# Patient Record
Sex: Male | Born: 2000 | Race: White | Hispanic: No | Marital: Single | Smoking: Never smoker
Health system: Southern US, Community
[De-identification: ages and names within clinical notes are randomized; demographics above are authoritative.]

## PROBLEM LIST (undated history)

## (undated) DIAGNOSIS — J45909 Unspecified asthma, uncomplicated: Secondary | ICD-10-CM

## (undated) DIAGNOSIS — F909 Attention-deficit hyperactivity disorder, unspecified type: Secondary | ICD-10-CM

## (undated) DIAGNOSIS — F4000 Agoraphobia, unspecified: Secondary | ICD-10-CM

## (undated) DIAGNOSIS — F410 Panic disorder [episodic paroxysmal anxiety] without agoraphobia: Secondary | ICD-10-CM

## (undated) DIAGNOSIS — F411 Generalized anxiety disorder: Secondary | ICD-10-CM

## (undated) DIAGNOSIS — F902 Attention-deficit hyperactivity disorder, combined type: Secondary | ICD-10-CM

## (undated) DIAGNOSIS — F319 Bipolar disorder, unspecified: Secondary | ICD-10-CM

## (undated) DIAGNOSIS — F259 Schizoaffective disorder, unspecified: Secondary | ICD-10-CM

## (undated) HISTORY — PX: TYMPANOPLASTY: SHX33

## (undated) HISTORY — DX: Agoraphobia, unspecified: F40.00

## (undated) HISTORY — DX: Unspecified asthma, uncomplicated: J45.909

## (undated) HISTORY — DX: Attention-deficit hyperactivity disorder, combined type: F90.2

## (undated) HISTORY — DX: Attention-deficit hyperactivity disorder, unspecified type: F90.9

## (undated) HISTORY — DX: Generalized anxiety disorder: F41.1

## (undated) HISTORY — DX: Panic disorder (episodic paroxysmal anxiety): F41.0

## (undated) HISTORY — DX: Bipolar disorder, unspecified: F31.9

## (undated) HISTORY — DX: Schizoaffective disorder, unspecified: F25.9

---
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
## 2022-04-02 ENCOUNTER — Emergency Department
Admission: EM | Admit: 2022-04-02 | Discharge: 2022-04-02 | Disposition: A | Discharge status: Home / Self Care | Payer: Medicaid - Out of State | Attending: Emergency Medicine | Admitting: Emergency Medicine

## 2022-04-02 ENCOUNTER — Emergency Department: Payer: Medicaid - Out of State

## 2022-04-02 DIAGNOSIS — M79645 Pain in left finger(s): Secondary | ICD-10-CM | POA: Insufficient documentation

## 2022-04-02 DIAGNOSIS — Z23 Encounter for immunization: Secondary | ICD-10-CM | POA: Insufficient documentation

## 2022-04-02 DIAGNOSIS — S61012A Laceration without foreign body of left thumb without damage to nail, initial encounter: Secondary | ICD-10-CM | POA: Insufficient documentation

## 2022-04-02 DIAGNOSIS — Y990 Civilian activity done for income or pay: Secondary | ICD-10-CM | POA: Insufficient documentation

## 2022-04-02 DIAGNOSIS — W293XXA Contact with powered garden and outdoor hand tools and machinery, initial encounter: Secondary | ICD-10-CM | POA: Insufficient documentation

## 2022-04-02 MED ORDER — CEPHALEXIN 500 MG PO CAPS
500.0000 mg | ORAL_CAPSULE | Freq: Four times a day (QID) | ORAL | 0 refills | Status: AC
Start: 2022-04-02 — End: 2022-04-12

## 2022-04-02 MED ORDER — TETANUS-DIPHTH-ACELL PERTUSSIS 5-2.5-18.5 LF-MCG/0.5 IM SUSY
PREFILLED_SYRINGE | INTRAMUSCULAR | Status: AC
Start: 2022-04-02 — End: ?
  Filled 2022-04-02: qty 0.5

## 2022-04-02 MED ORDER — TETANUS-DIPHTH-ACELL PERTUSSIS 5-2.5-18.5 LF-MCG/0.5 IM SUSY
0.5000 mL | PREFILLED_SYRINGE | INTRAMUSCULAR | Status: AC | PRN
Start: 2022-04-02 — End: 2022-04-02
  Administered 2022-04-02: 0.5 mL via INTRAMUSCULAR

## 2022-04-02 MED ORDER — LIDOCAINE HCL 1 % IJ SOLN
10.0000 mL | Freq: Once | INTRAMUSCULAR | Status: AC
Start: 2022-04-02 — End: 2022-04-02
  Administered 2022-04-02: 10 mL
  Filled 2022-04-02: qty 10

## 2022-04-02 MED ORDER — IBUPROFEN 600 MG PO TABS
600.0000 mg | ORAL_TABLET | Freq: Three times a day (TID) | ORAL | 0 refills | Status: AC | PRN
Start: 2022-04-02 — End: ?

## 2022-04-02 NOTE — ED Provider Notes (Signed)
EMERGENCY DEPARTMENT  History and Physical Exam       Patient Name: Todd Kerr, Todd Kerr  Encounter Date:  04/02/2022  Attending Physician: Nigel Sloop, DO.  PCP: No primary care provider on file.  Patient DOB:  Sep 15, 2000  MRN:  [id number]  Room:  E58/E58-A    History obtained from: Patient    Past Medical Records reviewed: no previous records available to review    Other Medical Conditions that Impact Care: Bipolar disorder, schizophrenia    Differential Diagnosis Considered: Laceration +/- nail bed injury +/- digit dislocation/fracture +/- nerve injury    All tests were independently reviewed and interpreted by myself. The significant findings are as follows:    Lab Results:  Results       ** No results found for the last 24 hours. **            Radiology Results:  XR Hand Left PA And Lateral    Result Date: 04/02/2022  Normal examination of the left hand. ReadingStation:[id number]      Procedures: Yes, see Procedure Section below for procedure note for details    Meds Given In ED: (Boldface indicate High Risk Medications that Required Monitoring for adverse effects or deterioration:  Medications   tetanus-diphth-acell pertussis (BOOSTRIX) injection syringe 0.5 mL (has no administration in time range)   lidocaine (XYLOCAINE) 1 % injection 10 mL (10 mLs Other Given by Other 04/02/22 1502)       Discussion about de-escalation of care or Code Status: No    Discussions regarding hospitalization or transfer: No    Discussion with Consultants: No    ED Course as of 04/02/22 1530   Tue Apr 02, 2022   1447 Plain films of affect extremity only reviewed and interpreted by me; no acute dislocation/fracture or retained foreign body.  Formal radiology interpretation reviewed; no change. [JK]      ED Course User Index  [JK] Deliah Goody Freddie Breech, DO        Narrative: The patient was hemodynamically stable on initial presentation with an exam as detailed below notable for a laceration over the patient's left metacrapophalangeal joint.  Plain films of his affected extremity were negative for any dislocation/fracture or foreign body. Based on gross inspection, joint violation unlikely.  Wound was repaired as detailed below she tolerated well.  He is neurovascularly intact after the procedure.  His last tetanus was in August 2010, therefore Tdap was ordered in the emergency room.  Based on the likelihood of his wound being contaminated based on the tool he was using, he will be placed on prophylactic antibiotics.  Follow-up instructions and return precautions are included in his discharge paperwork.    Prescription Management:    New Meds:   New Prescriptions    CEPHALEXIN (KEFLEX) 500 MG CAPSULE    Take 1 capsule (500 mg) by mouth 4 (four) times daily for 10 days    IBUPROFEN (ADVIL) 600 MG TABLET    Take 1 tablet (600 mg) by mouth every 8 (eight) hours as needed for Pain        Medications Changed:   Modified Medications    No medications on file       Discontinued Meds:   Discontinued Medications    No medications on file         Social Determinants of Health that Impact Care: None that negatively impact care      Diagnosis / Disposition     Clinical Impression  1. Laceration of left thumb  without foreign body without damage to nail, initial encounter    2. Need for Tdap vaccination        Disposition  ED Disposition       ED Disposition   Discharge    Condition   --    Date/Time   Tue Apr 02, 2022  3:27 PM    Comment   Todd Kerr discharge to home/self care.    Condition at disposition: Stable                 Follow up for Discharged Patients  No follow-up provider specified.              History of Presenting Illness     Chief complaint: Hand Injury    HPI/ROS is limited by: none  HPI/ROS given by: Patient    Todd Kerr is a 21 y.o. male who presents to the ED with complaint of left thumb laceration that he sustained while using a brush saw at work.  He denies additional musculoskeletal injuries.  He is complaining of moderate, 8/10  pain to the site (located on the backside of his thumb over his metacarpophalangeal joint).  He denies associated numbness or weakness.  He denies any underlying bleeding disorder.  He is unsure when his last tetanus shot was.  No other systemic complaints. He is unsure when his last tetanus was.       Review of Systems   Review of Systems   Constitutional:  Negative for chills and fever.   HENT:  Negative for congestion, sore throat and trouble swallowing.    Respiratory:  Negative for cough and shortness of breath.    Cardiovascular:  Negative for chest pain and leg swelling.   Gastrointestinal:  Negative for abdominal pain, diarrhea, nausea and vomiting.   Genitourinary:  Negative for dysuria and hematuria.   Musculoskeletal:  Positive for myalgias. Negative for arthralgias, back pain, neck pain and neck stiffness.   Skin:  Positive for wound (over left thumb). Negative for rash.   Neurological:  Negative for dizziness, weakness, light-headedness, numbness and headaches.   Hematological:  Negative for adenopathy.   All other systems reviewed and are negative.       Other pertinent ROS findings in HPI.  Allergies & Medications     Pt has No Known Allergies.    Current/Home Medications    ARIPIPRAZOLE (ABILIFY PO)    Take by mouth    DIVALPROEX SODIUM (DEPAKOTE PO)    Take by mouth    QUETIAPINE (SEROQUEL) 300 MG TABLET    Take 1 tablet (300 mg) by mouth nightly        Past Medical History     Pt   Past Medical History:   Diagnosis Date    Bipolar 1 disorder     schitzophrenia         Past Surgical History     Pt History reviewed. No pertinent surgical history.     Family History     The family history is not on file.     Social History     Pt reports that he has never smoked. He has never used smokeless tobacco. He reports current drug use. He reports that he does not drink alcohol.     Physical Exam     Blood pressure 125/65, pulse 91, temperature 97.1 F (36.2 C), resp. rate 17, height 1.88 m, weight 75.1 kg,  SpO2 100 %.    Vitals signs reviewed.  Physical Exam  Vitals and nursing note reviewed.   Constitutional:       General: He is in acute distress.      Appearance: Normal appearance. He is normal weight. He is not ill-appearing or toxic-appearing.   HENT:      Head: Normocephalic and atraumatic.      Right Ear: External ear normal.      Left Ear: External ear normal.      Nose: Nose normal.   Eyes:      Extraocular Movements: Extraocular movements intact.      Conjunctiva/sclera: Conjunctivae normal.   Cardiovascular:      Rate and Rhythm: Normal rate.      Pulses: Normal pulses.   Pulmonary:      Effort: Pulmonary effort is normal.      Breath sounds: No wheezing.   Musculoskeletal:         General: Signs of injury (laceration over dorsal portion of left metacarpophalangeal joint) present.      Cervical back: Normal range of motion and neck supple.   Skin:     General: Skin is warm and dry.      Capillary Refill: Capillary refill takes less than 2 seconds.   Neurological:      General: No focal deficit present.      Mental Status: He is alert and oriented to person, place, and time.   Psychiatric:         Mood and Affect: Mood normal.         Behavior: Behavior normal.            Procedures   Lac Repair    Date/Time: 04/02/2022 2:52 PM    Performed by: Raford Pitcher, DO  Authorized by: Raford Pitcher, DO    Consent:     Consent obtained:  Verbal    Consent given by:  Patient    Risks discussed:  Infection, pain, retained foreign body, need for additional repair, poor cosmetic result and nerve damage  Universal protocol:     Imaging studies available: yes      Patient identity confirmed:  Verbally with patient  Anesthesia:     Anesthesia method:  Local infiltration    Local anesthetic:  Lidocaine 1% WITH epi  Laceration details:     Location:  Finger    Finger location:  L thumb    Length (cm):  5.5    Depth (mm):  6  Pre-procedure details:     Preparation:  Patient was prepped and draped in usual sterile  fashion and imaging obtained to evaluate for foreign bodies  Exploration:     Limited defect created (wound extended): yes      Imaging outcome: foreign body not noted      Wound exploration: wound explored through full range of motion      Wound extent: no fascia violation noted, no foreign bodies/material noted, no muscle damage noted, no tendon damage noted and no vascular damage noted      Contaminated: no    Treatment:     Amount of cleaning:  Extensive    Irrigation solution:  Tap water    Irrigation volume:  3 minutes of continuous irrigation    Irrigation method:  Tap    Visualized foreign bodies/material removed: no      Debridement:  Minimal    Undermining:  None  Skin repair:     Repair method:  Sutures    Suture size:  4-0    Suture material:  Nylon    Suture technique:  Simple interrupted    Number of sutures:  11  Approximation:     Approximation:  Close  Repair type:     Repair type:  Intermediate  Post-procedure details:     Dressing:  Open (no dressing)    Procedure completion:  Tolerated            Note:  This chart was generated by the Epic EMR system/speech recognition and may contain inherent errors or omissions not intended by the user. Grammatical errors, random word insertions, deletions, pronoun errors and incomplete sentences are occasional consequences of this technology due to software limitations. Not all errors are caught or corrected. If there are questions or concerns about the content of this note or information contained within the body of this dictation they should be addressed directly with the author for clarification        Raford Pitcher, DO  04/02/22 1535

## 2022-04-02 NOTE — Discharge Instructions (Signed)
You were seen for a laceration to your thumb which was repaired in the ER.  Please keep the wound dry for the first 24 hours, after which you may shower as normal.  Your stitches will need to be removed in 12-14 days.  We have prescribed an antibiotic to help prevent any infection from forming, please take it as directed, as well as the anti-inflammatory of prescribed for you.  If you develop persistent severe pain, redness around the wound, the wound opens back up, you notice bleeding or discharge, return to the ER sooner.  Thank you!

## 2023-04-09 ENCOUNTER — Encounter: Payer: Self-pay | Admitting: Physician Assistant

## 2023-04-09 ENCOUNTER — Ambulatory Visit (INDEPENDENT_AMBULATORY_CARE_PROVIDER_SITE_OTHER): Payer: MEDICAID | Admitting: Physician Assistant

## 2023-04-09 VITALS — BP 116/71 | HR 79 | Wt 200.2 lb

## 2023-04-09 DIAGNOSIS — Z0289 Encounter for other administrative examinations: Secondary | ICD-10-CM | POA: Diagnosis not present

## 2023-04-09 DIAGNOSIS — Z111 Encounter for screening for respiratory tuberculosis: Secondary | ICD-10-CM

## 2023-04-09 DIAGNOSIS — Z114 Encounter for screening for human immunodeficiency virus [HIV]: Secondary | ICD-10-CM

## 2023-04-09 DIAGNOSIS — Z1159 Encounter for screening for other viral diseases: Secondary | ICD-10-CM

## 2023-04-09 NOTE — Progress Notes (Signed)
Acute Office Visit   Patient: Steve Murray   DOB: 10-24-2000   22 y.o. Male  MRN: [id number] Visit Date: 04/09/2023  Today's healthcare provider: Oswaldo Conroy Dorothia Passmore, PA-C  Introduced myself to the patient as a Secondary school teacher and provided education on APPs in clinical practice.    Chief Complaint  Patient presents with   Testing    Patient is applying to go to Rehab and the facility are requesting lab work prior.    Subjective    HPI HPI     Testing    Additional comments: Patient is applying to go to Rehab and the facility are requesting lab work prior.       Last edited by Malen Gauze, CMA on 04/09/2023 11:15 AM.       Patient is here with his mother  Patient is requesting labs for rehab facility admission as well as form completion  Please see scanned form for further information   Patient and mother were made aware that he will need to re-establish as a patient with PCP as his last visit was >4 years ago  They agreed to schedule new patient visit and physical in about 3 months after he completes his stay in rehab facility      Medications: Outpatient Medications Prior to Visit  Medication Sig   ARIPiprazole (ABILIFY) 15 MG tablet Take 15 mg by mouth daily.   hydrOXYzine (ATARAX) 25 MG tablet Take 25 mg by mouth at bedtime.   QUEtiapine (SEROQUEL) 300 MG tablet Take by mouth.   cloNIDine HCl (KAPVAY) 0.1 MG TB12 ER tablet Take 0.1 mg by mouth at bedtime.  (Patient not taking: Reported on 04/09/2023)   dexmethylphenidate (FOCALIN) 10 MG tablet Take 10 mg by mouth daily. (Patient not taking: Reported on 04/09/2023)   Dexmethylphenidate HCl 40 MG CP24 Take 1 capsule (40 mg total) by mouth daily. Must be manufactured by [employer] (Patient not taking: Reported on 04/09/2023)   divalproex (DEPAKOTE ER) 250 MG 24 hr tablet Take 250 mg by mouth daily. (Patient not taking: Reported on 04/09/2023)   divalproex (DEPAKOTE ER) 500 MG 24 hr tablet Take 1,000 mg by mouth daily.   (Patient not taking: Reported on 04/09/2023)   [DISCONTINUED] LATUDA 60 MG TABS    No facility-administered medications prior to visit.    Review of Systems      Objective    BP 116/71   Pulse 79   Wt 200 lb 3.2 oz (90.8 kg)   SpO2 98%     Physical Exam Vitals reviewed.  Constitutional:      General: He is awake.     Appearance: Normal appearance. He is well-developed and well-groomed.  HENT:     Head: Normocephalic and atraumatic.  Pulmonary:     Effort: Pulmonary effort is normal.  Neurological:     Mental Status: He is alert.  Psychiatric:        Attention and Perception: Attention and perception normal.        Mood and Affect: Mood normal.        Speech: Speech normal.        Behavior: Behavior is cooperative.       No results found for any visits on 04/09/23.  Assessment & Plan      Return in about 3 months (around 07/10/2023) for with PCP - has not been seen in a long tome , Annual physical.     Problem List Items  Addressed This Visit   None Visit Diagnoses     Encounter for completion of form with patient    -  Primary   Relevant Orders   HIV antibody (with reflex)   Hepatitis C antibody   QuantiFERON-TB Gold Plus   Screening for tuberculosis       Relevant Orders   QuantiFERON-TB Gold Plus   Encounter for hepatitis C screening test for low risk patient       Relevant Orders   Hepatitis C antibody   Screening for HIV (human immunodeficiency virus)       Relevant Orders   HIV antibody (with reflex)        Return in about 3 months (around 07/10/2023) for with PCP - has not been seen in a long tome , Annual physical.   I, Sheriece Jefcoat E Jerri Glauser, PA-C, have reviewed all documentation for this visit. The documentation on 04/09/23 for the exam, diagnosis, procedures, and orders are all accurate and complete.   Jacquelin Hawking, MHS, PA-C [facility]

## 2023-04-12 LAB — QUANTIFERON-TB GOLD PLUS
QuantiFERON Mitogen Value: >10 [IU]/mL
QuantiFERON Nil Value: 0 [IU]/mL
QuantiFERON TB1 Ag Value: 0 [IU]/mL
QuantiFERON TB2 Ag Value: 0.03 [IU]/mL
QuantiFERON-TB Gold Plus: NEGATIVE

## 2023-04-12 LAB — HIV ANTIBODY (ROUTINE TESTING W REFLEX): HIV Screen 4th Generation wRfx: NONREACTIVE

## 2023-04-12 LAB — HEPATITIS C ANTIBODY: Hep C Virus Ab: NONREACTIVE

## 2023-04-14 ENCOUNTER — Telehealth: Payer: Self-pay | Admitting: Family Medicine

## 2023-04-14 NOTE — Progress Notes (Signed)
Your HIV screening, Hep C screening and TB testing were negative. We will have your form filled out and ready to pick up in the next few days.

## 2023-04-14 NOTE — Telephone Encounter (Signed)
Copied from CRM [id number]. Topic: General - Other >> Apr 14, 2023 12:21 PM [name] wrote: Reason for CRM: Pt mom Steve Murray is calling stating that the facility that her son is going to for rehab still have not gotten his lab work from his office visit on 04/09/2023. [name] states that they are needing the lab work faxed over to them. Fax # [phone].  [name] would like a call back to discuss

## 2023-07-10 ENCOUNTER — Encounter: Payer: MEDICAID | Admitting: Family Medicine
